# Patient Record
Sex: Male | Born: 1960 | Race: White | Hispanic: No | Smoking: Current every day smoker
Health system: Southern US, Academic
[De-identification: ages and names within clinical notes are randomized; demographics above are authoritative.]

## PROBLEM LIST (undated history)

## (undated) DIAGNOSIS — M545 Low back pain, unspecified: Secondary | ICD-10-CM

## (undated) DIAGNOSIS — K219 Gastro-esophageal reflux disease without esophagitis: Secondary | ICD-10-CM

## (undated) DIAGNOSIS — E119 Type 2 diabetes mellitus without complications: Secondary | ICD-10-CM

## (undated) DIAGNOSIS — I10 Essential (primary) hypertension: Secondary | ICD-10-CM

## (undated) DIAGNOSIS — G629 Polyneuropathy, unspecified: Secondary | ICD-10-CM

## (undated) DIAGNOSIS — F419 Anxiety disorder, unspecified: Secondary | ICD-10-CM

## (undated) HISTORY — PX: HX CORONARY ARTERY BYPASS GRAFT: SHX141

## (undated) HISTORY — PX: HX ACL RECONSTRUCTION: SHX115

## (undated) HISTORY — DX: Low back pain, unspecified: M54.50

## (undated) HISTORY — DX: Type 2 diabetes mellitus without complications: E11.9

## (undated) HISTORY — PX: CORONARY ARTERY BYPASS GRAFT: SHX141

## (undated) HISTORY — DX: Polyneuropathy, unspecified: G62.9

## (undated) HISTORY — DX: Essential (primary) hypertension: I10

## (undated) HISTORY — DX: Gastro-esophageal reflux disease without esophagitis: K21.9

## (undated) HISTORY — DX: Anxiety disorder, unspecified: F41.9

---
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
## 2013-04-09 ENCOUNTER — Ambulatory Visit (HOSPITAL_COMMUNITY): Payer: Self-pay | Admitting: Nurse Practitioner

## 2013-08-01 ENCOUNTER — Encounter (HOSPITAL_COMMUNITY): Payer: Self-pay

## 2013-08-01 NOTE — Ancillary Notes (Addendum)
[facility] Record for: Derrick Morris, Derrick Morris  Created: 07/31/2013 3:11:25 PM  MRN: [id number]  DOB: 1960-07-05  SSN: [id number]  Sex: Male  Height: 6 Feet 0 Inches - Weight: 255  Maiden Name:   Address:   [address]  [place]: [place], [place] [address]  E-Mail:   Day Phone: [phone] Night Phone:  Other Phone:   Phone comments:   Call Back time:   Authorized contact: Judye Bos  Intake Date: 07/31/2013 3:11:25 PM  PCP: [facility] ,    RefMD: Vicenta Dunning MD, Abdi   Primary Insurance: Medicaid - [place]  Secondary Insurance:   Insurance Comments:      -------Referral Assignment------  Where was the original referral directed?: Physician Practice  Was a specific reviewer requested?: Unassigned Referral  How was the reviewer selected?: Rotation  Who requested the specific reviewer?:   How did you hear of our spine program?:   Is this a second opinion?:      -------Merchandiser, retail----------  [employer]:   City of Birth:      ---------- 1st Review ----------     Review Date: 08/12/2013 12:00:20 PM  Review Completed by: Anice Paganini  Impression: Limb Pain  Disposition: Appointment with Me, Other Treatment or Testing  Appt w/ Colleague:   Colleague Name:   Appt How Soon:   Pre Treatment Type: MRI  Pre Treatment Type Details: MRI Type: Lumbar   Pre Treatment Other Test:   Appt Type: First Available Appointment  Instructions: 53 yo male with long history of back and leg pain worse since January. Reported remote history of fracture.  No PT since 4 years ago.  Now has R buttock pain and urinary symptoms but MRI is from before these sympotms.  Had recent ESI at [facility]. He needs new MRI as well as xrays of lumbar spine and hips (ordered) and I can see in clinic with those studies.       ---------- Symptoms ----------     Chief complaint: lower back pain- fx L4-L5  Diagnosis from Other MD:      Symptoms: Pain,Numbness and/or tingling  Other Symptom Description:   Pain Location: Low  back,Hip,Leg,Buttocks,Other  Other Pain Location Description: groin  Where is your pain the worst?: Buttocks  Pain Type: Constant,Dull/Aching,Radiating  Pain Rating: 9  Does the pain radiate to other parts of your body? Yes   Radiate Where: From low back to left leg,From low back to right leg  Other Description:   Does it radiate to the fingers?   Does it radiate below the elbow?   Which specific part of your arm?   Which fingers?   Which part of your arm does pain go to?   How does it radiate to the arm?   Does it radiate to the toes?   Does it radiate below the knee? No  Which specific part of your leg?   Which toes?   Which part of your leg does pain go to? Left Knee,Right Knee  How does it radiate to the leg? Back  Additional pain information right buttock to knee area is the worst painstabbing pain in right hip and buttock area at times"muscle spams that locks me up and unable to walk at times"     Location of Numbness/tingling: Toes,Other  Other Description:numbness in groin- gential  area when sitting for period of time- for last 2-3 years has old fracture from when in his 39's  and then worse 4 years ago  and then these worse symtpoms in Jan 2015  Does numbness/tingling radiate to other parts of your body?:No  Where does the numbess/tingling radiate?:  Other Description:  Does the numbness/tingling radiate below your elbow?:  Which specific part of your arm?:  Which fingers:  Which pat of your arm does the numbness/tingling go to?:  How does the numbness/tingling radiate to your arm?:  Does the numbness/tingling radiate below your knee?:  Which specific part of your leg?:  Which toes:  Which part of your leg does the numbness/tingling go to?:  How does the numbness/tingling radiate to your leg?:  Additional Numbness/tingling information:  Other Description:      Location of Weakness:   Other Description:   Additional Weakness Information:      The symptoms have been present for: 3 - 6 months  The symptoms  began: Jan 2015  Was there a specific event that caused your symptoms?: With no known cause  Additional Narrative Description:   Are you able to perform your daily activities with these symptoms?: Yes  Since what date have you been unable to perform your daily routine?:   The symptoms improve when you: Lie down  Other activities that improve your symptoms:   The symptoms worsen when you: Walk  Other activities that worsen your symptoms: very difficult to walk     ---------- Work History ----------     Are you able to work?: Does not apply  Reasons for not working: Unemployed  Other Reason for not working:   Do you have Work Restrictions:   If applicable, maximum lifting restriction:   How long have you been unable to work:   Have you ever filed a W/C claim related to a neck or back injury?: N  Occupation:   Other Occupation Information:      ---------- Bowel/Bladder/Incontinence Issues ----------     Since the onset of symptoms, have you experienced any new problems urinating or having bowel movements?: Yes  Description: Loss of control of urine causing an accident,Numbness of genital area  Other Description: for last two months has been unable to hold stream from just happeningconstipation all the time  How long have you had these bowel/bladder problems?: 3 or more weeks     ---------- Allergies ----------     Do you have any medication allergies? Yes  Allergies: PCN  Other Details:   Allergic to Latex?: NKA  Allergic to intravenous contrast dyes?: NKA  Allergic to steroids?: NKA     ---------- Treatment/Testing ----------      Taking prescription medication for this problem?: Yes  Are you using any now?: No  Have you received a Medrol dose pack for this problem?: No  When did you last take the dosepack?:   Were your symptoms improved?:   Would you describe your relief as:   How long did you experience that amount of relief?:   Other Medrol dosepack information:      --------------- PT ---------------     PT:  No  When Received:   Where Received:   Other where received information:   Visits:   Types:   Other Types:   Improved:      If improved, describe level of relief:   If improved, how long did you experience relief:   Other Physical Therapy Treatment Information:      ---------- Chiropractic Services ----------     Chiro: Yes  When: 4 years ago   Who:   Visits: 2  Types: Ice/Heat,Manipulation,Modalities - Ultrasound/ E-stim  Other Types:   Were Symptoms Improved: Yes  If improved, describe level of relief: Small  If improved, how long did you experience relief: Short term  Other Chiropractic Treatment Information: 1st time helped and then 2nd time made symptoms worse     --------------- ESI ---------------     ESI: Yes  When: last injection a month ago  Who: [facility]  Visits: 3  Types: ESI  Improved: No  If improved, describe level of relief:   If improved, how long did you experience relief:   Other Injection Treatment Information:      ---------- Diagnostic Tests ----------     Plain spine X-rays On:04-09-13 Where: [facility]  Area(s) Scanned: Lumbar  MRI scan ZO:[id number] at Review Where:   Area Scanned: Lumbar  SI Joints, RT Hip Pelvis XR  On:04-09-13 Where: [facility]   Do you have any of the following in case an MRI is ordered?: None  Other MRI factors: 2010- bypass surgeryno hx cancer     ---------- Past Medical History ----------     What other doctors/providers have treated you for these spine issues? Dr. Vicenta Dunning Date: referring    Ever diagnosed with Spine deformity?:   Prior neck or back surgery (1)?: No  When:   Who:   Area of neck/spine operated on:   Level:   Were symptoms improved?:   If improved, describe level of relief:  If improved, how long did you experience relief?:   Prior neck or back surgery (2)?:   When:   Who:   Area of neck/spine operated on:   Level:   Were symptoms improved?:   If improved, describe level of relief:  If improved, how long did you experience relief?:   Prior neck or back surgery  (3)?:   When:   Who:   Area of neck/spine operated on:   Level:   Were symptoms improved?:   If improved, describe level of relief:  If improved, how long did you experience relief?:   Do you have any of the following assistive devices? Brace  How long have you required these assistive devices? left knee brace  Currently being treated for any other medical condition?: Yes  Conditions: Diabetes,GERDS,Hypercholesterolemia,Hypertension  Other Conditions:   Type of neurologic disorder:   Cancer Type:   Other Treatment/Medication: metformin,glucopaughe,gabapentin- will bring complete list  What blood thinners are you currently taking?: Aspirin  Which physicians are treating you for your other medical conditions?:   Do you smoke?: Yes  Are you pre-menopausal or post-menopausal?:   If recommended, are you willing to consider surgery?:   What is your goal in seeking treatment?:   Other pertinent information/ general comments/ goals for treatment:      ---------- Care Coordinator Information ----------     Care Coordinator: RS  Activity Log: 08/01/2013 12:00:50 PM [Leslie Crossley]: Phone call to pt to clarify his symptoms.  Pt reports urinary, bowel and incontinence issues starting the past 1 1/2 to 2 months, which is worsening.  He reports he has saddle numbness, impotence, trouble making it to the restroom before he has urinary accidents during daytime and nighttime hours.  He reports these symptoms are worsening.  Per referral, pt has significant pathology at L4-5 on MRI from February, 2015 and Dr. Vicenta Dunning has recommended surgery.  Pt reports these symptoms began after this MRI.  Instructed pt to report to [facility] and pt declines, stating he is leaving for  [place] on Monday.  Requested he be evaluated today due to potential urgent spine issue which could potentially cause permanent paralysis or permanent loss of bowel/bladder function.  Pt again declines.  Instructed him to hand carry his MRI with  him to [place] so that if his symptoms worsen, he can be evaluated while OOT.  Dr. Veleta MinersGhodsi's nurse Amy notified.  Instructed pt to have films mailed for surgeon review while he is OOT and he agrees.  Carroll KindsL. Crossley, RN6/11/2013 1:01:49 PM Verlon Au[Leslie Crossley]: Phone call to patient to discuss MD initial impression and recommendations.  Left message and phone  on voice mail for patient to return call.  Carroll KindsL. Crossley, RN6/11/2013 4:01:40 PM Verlon Au[Leslie Crossley]: Patient returned phone call. Reviewed patient's medical history.  Patient states no change in symptoms.  Discussed MD initial impression and recommendations for a lumbar MRI followed by evaluation by Dr. Rod HollerSedney with x-rays.  Provided education on need for updated imaging.  ; Patient chose to accept recommendations.  ; Instructed Referral Specialist will call to schedule appointment/tests once a signed order and insurance authorization are received.  ; Explained that a letter will be faxed to the PCP/referring physician regarding this conversation. Patient verbalized understanding. Carroll KindsL. Crossley, RN 08/28/2013 2:23:20 PM Lawson Fiscal[Lori Mueller]: RS will offer nonoperative provider evaluation to obtain the imaging. LMueller RN CRRN     Letter/Test Coordinator: Letters  Letter/Test Coordinator Log: 08/13/2013 8:12:06 AM Elnita Maxwell[Cheryl Chidester]: Faxed treatment letter patient summary and requisition to Dr. Vicenta DunningGhodsi. CChidester 08/14/2013 8:36:30 AM Rosey Bath[Teresa Benson]: Called and left message for Dr. Vicenta DunningGhodsi nurse Amy for status of requisition and authorization. TBenson 08/15/2013 8:32:01 AM [Gina Wood]: Dr. Veleta MinersGhodsi's receptionist took a message for his nurse Amy to call us regarding the status of MRI requisition. GWood 08/18/2013 4:09:29 PM [Gina Wood]: Requested no authorization and requistion letter to be sent to Dr. Veleta MinersGhodsi's office. GWood 08/19/2013 7:51:57 AM [Cheryl Chidester]: Faxed no requisition letter and refaxed requisition to Dr. Vicenta DunningGhodsi. CChidester.   08/26/2013 10:51:17 AM Rosey Bath[Teresa Benson]: Called and  spoke to patient today and explained that we have not gotten anything back from Dr. Veleta MinersGhodsi's office and he said he did speak to the nurse last Thursday or Friday and Dr. Vicenta DunningGhodsi was out of town and the nurse said he would back today (Tuesday ) and she woud get on this to get done and send back to us. Patient is going to call again today and follow up. Told patient we would give it another couple days and if no luck then we will need to figure out a different route. He understood and will call today. TBenson 08/28/2013 9:41:55 AM Rosey Bath[Teresa Benson]: Patient called and said that Dr. Vicenta DunningGhodsi office is refusing to help with testing ordering and authorization. Will put in RN que to review. TBenson 08/29/2013 9:32:48 AM Aggie Cosier[Crystal Tephabock]: Attempted to contact patient to facilitate scheduling.  Left message and phone  on answering machine for patient to return this call. CVassie Loll. Tephabock 08/29/2013 11:06:30 AM Aggie Cosier[Crystal Tephabock]: Appointment scheduled to see Dr. Burnadette PeterLynch on June 30th at 9:30am. Confirmed with pt. Ctephabock     Intake Specialist Comments:      Clinic Staff Comments:      Last Edited byRaelene Bott: Crystal Tephabock on 08/29/2013 11:07:11 AM  Last Review by: Anice Paganiniara Sedney on 08/12/2013 12:00:20 PM

## 2013-08-12 ENCOUNTER — Other Ambulatory Visit (HOSPITAL_COMMUNITY): Payer: Self-pay | Admitting: Neurological Surgery

## 2013-08-12 DIAGNOSIS — M549 Dorsalgia, unspecified: Secondary | ICD-10-CM

## 2013-09-02 ENCOUNTER — Ambulatory Visit: Payer: MEDICAID | Attending: PHYSICAL MEDICINE AND REHABILITATION | Admitting: PHYSICAL MEDICINE AND REHABILITATION

## 2013-09-02 ENCOUNTER — Ambulatory Visit (HOSPITAL_BASED_OUTPATIENT_CLINIC_OR_DEPARTMENT_OTHER): Payer: MEDICAID

## 2013-09-02 ENCOUNTER — Encounter (INDEPENDENT_AMBULATORY_CARE_PROVIDER_SITE_OTHER): Payer: Self-pay | Admitting: PHYSICAL MEDICINE AND REHABILITATION

## 2013-09-02 VITALS — BP 117/71 | HR 57 | Temp 98.1°F | Wt 250.2 lb

## 2013-09-02 DIAGNOSIS — M5136 Other intervertebral disc degeneration, lumbar region: Secondary | ICD-10-CM

## 2013-09-02 DIAGNOSIS — R209 Unspecified disturbances of skin sensation: Secondary | ICD-10-CM | POA: Insufficient documentation

## 2013-09-02 DIAGNOSIS — M549 Dorsalgia, unspecified: Secondary | ICD-10-CM

## 2013-09-02 DIAGNOSIS — M169 Osteoarthritis of hip, unspecified: Secondary | ICD-10-CM

## 2013-09-02 DIAGNOSIS — IMO0001 Reserved for inherently not codable concepts without codable children: Secondary | ICD-10-CM | POA: Insufficient documentation

## 2013-09-02 DIAGNOSIS — M545 Low back pain, unspecified: Secondary | ICD-10-CM

## 2013-09-02 DIAGNOSIS — M5137 Other intervertebral disc degeneration, lumbosacral region: Secondary | ICD-10-CM

## 2013-09-02 DIAGNOSIS — R269 Unspecified abnormalities of gait and mobility: Secondary | ICD-10-CM | POA: Insufficient documentation

## 2013-09-02 DIAGNOSIS — F17200 Nicotine dependence, unspecified, uncomplicated: Secondary | ICD-10-CM | POA: Insufficient documentation

## 2013-09-02 DIAGNOSIS — M1610 Unilateral primary osteoarthritis, unspecified hip: Secondary | ICD-10-CM

## 2013-09-02 DIAGNOSIS — G8929 Other chronic pain: Secondary | ICD-10-CM | POA: Insufficient documentation

## 2013-09-02 DIAGNOSIS — R29898 Other symptoms and signs involving the musculoskeletal system: Secondary | ICD-10-CM

## 2013-09-02 DIAGNOSIS — S335XXA Sprain of ligaments of lumbar spine, initial encounter: Secondary | ICD-10-CM

## 2013-09-02 DIAGNOSIS — M47817 Spondylosis without myelopathy or radiculopathy, lumbosacral region: Secondary | ICD-10-CM

## 2013-09-02 DIAGNOSIS — M51379 Other intervertebral disc degeneration, lumbosacral region without mention of lumbar back pain or lower extremity pain: Secondary | ICD-10-CM | POA: Insufficient documentation

## 2013-09-02 DIAGNOSIS — Z880 Allergy status to penicillin: Secondary | ICD-10-CM | POA: Insufficient documentation

## 2013-09-02 DIAGNOSIS — R32 Unspecified urinary incontinence: Secondary | ICD-10-CM | POA: Insufficient documentation

## 2013-09-02 DIAGNOSIS — R159 Full incontinence of feces: Secondary | ICD-10-CM | POA: Insufficient documentation

## 2013-09-02 NOTE — Progress Notes (Signed)
To be dictated  Ruben GottronJohn Brentney Goldbach, MD 09/02/2013, 09:33    To be dictated  Ruben GottronJohn Tallen Schnorr, MD 09/02/2013, 10:24    To be dictated  Ruben GottronJohn Ermalee Mealy, MD 09/02/2013, 10:38

## 2013-09-03 NOTE — H&P (Signed)
[facility]                              DEPART[facility] OF [place]                                , [place] [id number]                                PATIENT NAME: Derrick Morris, Derrick Morris  HOSPITAL NUMBER:[id number]  DATE OF SERVICE:09/02/2013  DATE OF BIRTH: 10-30-1960    HISTORY AND PHYSICAL    REFERRING PHYSICIAN:  Hansel FeinsteinSeyed Abdi Ghodsi, MD    HISTORY OF PRESENT ILLNESS:  [name] is a 53 year old white male who has a 30-year history of back problems.  He relates that he had increasing symptoms since 2011 and then further noticed increase in January 2015.  He relates he could not stand for a long period of time, had to quit working when back pain symptoms had worsened.  He also has developed new intermittent bowel and bladder incontinence issues.  He had a lumbar spine MRI that was reviewed by Dr. Rod HollerSedney.  She noted a remote history of back problems and noted increasing right buttock pain and urinary symptoms, but she noted his old MRI predated his increasing symptoms.  She requests a new MRI and she would see him.  He notes that in addition he has persistent low back pain.  He has referred pain down the right leg intermittently and cramping spasms down to his calf.  Both legs feel weak, left greater than right, and also gets some intermittent mid back pain.  He has some chronic left knee problems from ligamentous laxity and osteoarthritis and does wear a knee brace on the left knee and ambulates with a cane in his right hand.  He notes that he had 1 injection at [facility] April 2015 with no help.  He relates Dr. Vicenta DunningGhodsi had recommended surgery, but he wants a second opinion.    PAST MEDICAL HISTORY:  Coronary artery disease.  He has had bypass surgery.  He has a history of diabetes, peripheral neuropathy, elevated cholesterol, elevated blood pressure, anxiety, depression, gastric reflux, and left knee osteoarthritis.    PAST SURGICAL HISTORY:  [place] and [place] ACL repair in 1983.    CURRENT MEDICATIONS:  1.  Tylenol No. 3, he takes up to 3 a day.  2.  Gabapentin 600 three a day.  Other medicines per Epic.    ALLERGIES:  He is allergic to penicillin.    SOCIAL HISTORY:  He is unemployed, last worked January 2015.  Smokes 2 packs a day of cigarettes.  No alcohol.  Has applied for disability and been denied twice and is awaiting a hearing now.    FAMILY HISTORY:  He relates he is adopted.    REVIEW OF SYSTEMS:  He relates he has lost his appetite and he is down about 20 pounds in the last 4 months.  He relates he has difficulty urinating at times, difficulty holding his urine and has some urinary frequency.  General numbness at times.  Has had some incontinence of his urine back in February.  He relates he also has had some alternating constipation and diarrhea and has  had an episode of bowel incontinence when he woke up from sleep.  He has some chest pain when he is stressed and chronic knee problems, numbness and tingling he relates from his diabetes, anxiety, depression, sleeps about 4-5 hours on a good night's sleep.    PHYSICAL EXAMINATION:  He is ambulatory with a cane and left knee brace.  Weight 113 kg, blood pressure 117/71, pulse 57 and regular, temperature 98.1 degrees Fahrenheit.  He is alert and oriented.  No acute distress.  Lungs:  Clear.  Heart:  Regular rate and rhythm.  Neurologically:  Strength in lower extremities is grossly intact at 4+ to 5/5, left knee brace on.  Reflexes symmetric 1-2/4.  Tone is normal.  Back is tender across the paralumbrical region.  Gait is slow and antalgic with his cane.    IMPRESSION:  Chronic low back pain, lumbar degenerative disk disease, radicular symptoms down into his right leg.  He relates intermittent bowel and bladder incontinence and some general numbness.  Previous history of fracture of lumbar spine L4-L5.    PLAN:  We will obtain a new lumbar spine MRI before his appointment  with Dr. Rod HollerSedney.  We will also obtain lumbar spine and hip x-rays.  We will give him a slip for physical therapy and he will continue pain medicines per his family physician.  I will see him back after Dr. Rod HollerSedney has had a chance to see him.      Tacy LearnJ. Reyansh Kushnir, MD  Assistant Professor  [facility]    [id number]; D: 09/02/2013 11:12:05; T: 09/03/2013 08:06:13    cc: Hansel FeinsteinSeyed Abdi Ghodsi MD      [address]      [place], [place] [phone]

## 2013-09-12 ENCOUNTER — Ambulatory Visit (INDEPENDENT_AMBULATORY_CARE_PROVIDER_SITE_OTHER): Payer: Self-pay | Admitting: PHYSICAL MEDICINE AND REHABILITATION

## 2013-09-12 NOTE — Telephone Encounter (Signed)
Received the following message from Ortho scheduler:    Please advise patient's MRI has already been approved with Dr.Ghodsi office [facility]. Patient will contact his office to get MRI scheduled and will contact me back to advise when scheduled  Thanks

## 2013-09-16 ENCOUNTER — Ambulatory Visit (INDEPENDENT_AMBULATORY_CARE_PROVIDER_SITE_OTHER): Payer: Self-pay | Admitting: Neurological Surgery

## 2013-09-16 NOTE — Telephone Encounter (Signed)
-----   Message from Derrick Morris sent at 09/15/2013 11:06 AM EDT -----  >> Derrick Morris 09/15/2013 11:06 AM  Dr. Rod HollerSedney pt  Patient called stating that he has not had his 2nd MRI, he said Dr. Rod HollerSedney looked at his first MRI done in Feb and x rays through the spine center and determined he needed a 2nd MRI.  He wants to know if he should keep this apt on Thursday with her or should he reschedule, please call him to advise.

## 2013-09-16 NOTE — Telephone Encounter (Signed)
I called and advised pt that he is going to get the MRI done then r.s  Natale LayMichelle D Marguetta Windish, LPN  1/[id number]/47827/14/2015, 09:42

## 2013-09-18 ENCOUNTER — Ambulatory Visit (INDEPENDENT_AMBULATORY_CARE_PROVIDER_SITE_OTHER): Payer: Self-pay | Admitting: Neurological Surgery

## 2013-10-07 ENCOUNTER — Ambulatory Visit (INDEPENDENT_AMBULATORY_CARE_PROVIDER_SITE_OTHER): Payer: Self-pay | Admitting: PHYSICAL MEDICINE AND REHABILITATION

## 2013-10-07 NOTE — Telephone Encounter (Signed)
Fax[facility] signed PT [facility] [phone]) [phone].  Plan is for 2-3/week for 6 weeks.  Goals are strengthening, soft tissue/joint mobility, flexibility training, modalities, posture/body mech's, proprioception/balance, gait training, ADL training and HEP.

## 2013-10-17 ENCOUNTER — Ambulatory Visit (INDEPENDENT_AMBULATORY_CARE_PROVIDER_SITE_OTHER): Payer: Self-pay | Admitting: PHYSICAL MEDICINE AND REHABILITATION

## 2013-10-17 NOTE — Telephone Encounter (Signed)
Faxed a signed PT continuation of care as patient still requires verbal cues for strength, AROM/PROM, tissue extensibility/spasm, coordination/muscle recruitment, abdominal bracing and work/functional/daily activity capability.  The PT notes will be scanned to medical records.

## 2013-10-24 ENCOUNTER — Other Ambulatory Visit (INDEPENDENT_AMBULATORY_CARE_PROVIDER_SITE_OTHER): Payer: Self-pay | Admitting: PHYSICAL MEDICINE AND REHABILITATION

## 2013-10-24 DIAGNOSIS — M5136 Other intervertebral disc degeneration, lumbar region: Secondary | ICD-10-CM

## 2013-10-24 DIAGNOSIS — S335XXA Sprain of ligaments of lumbar spine, initial encounter: Secondary | ICD-10-CM

## 2013-10-24 DIAGNOSIS — M545 Low back pain, unspecified: Secondary | ICD-10-CM

## 2013-10-28 ENCOUNTER — Ambulatory Visit (INDEPENDENT_AMBULATORY_CARE_PROVIDER_SITE_OTHER): Payer: Self-pay | Admitting: PHYSICAL MEDICINE AND REHABILITATION

## 2013-10-28 NOTE — Telephone Encounter (Signed)
Please advise per patient's emergency contact he has moved back to [place] and will not be needing MRI here at [facility].

## 2013-11-25 ENCOUNTER — Ambulatory Visit (INDEPENDENT_AMBULATORY_CARE_PROVIDER_SITE_OTHER): Payer: Self-pay | Admitting: PHYSICAL MEDICINE AND REHABILITATION

## 2013-11-25 NOTE — Telephone Encounter (Signed)
Faxed a signed PT discharge to [facility] @ [phone].  Patient has moved.

## 2013-12-02 ENCOUNTER — Encounter (INDEPENDENT_AMBULATORY_CARE_PROVIDER_SITE_OTHER): Payer: MEDICAID | Admitting: PHYSICAL MEDICINE AND REHABILITATION

## 2014-02-12 ENCOUNTER — Ambulatory Visit (INDEPENDENT_AMBULATORY_CARE_PROVIDER_SITE_OTHER): Payer: Self-pay

## 2014-02-12 ENCOUNTER — Ambulatory Visit (INDEPENDENT_AMBULATORY_CARE_PROVIDER_SITE_OTHER): Payer: Self-pay | Admitting: Family Medicine

## 2014-02-12 VITALS — BP 124/70 | HR 68 | Temp 97.8°F | Resp 18 | Ht 71.5 in | Wt 255.2 lb

## 2014-02-12 DIAGNOSIS — R059 Cough, unspecified: Secondary | ICD-10-CM

## 2014-02-12 DIAGNOSIS — R062 Wheezing: Secondary | ICD-10-CM

## 2014-02-12 DIAGNOSIS — Z720 Tobacco use: Secondary | ICD-10-CM

## 2014-02-12 DIAGNOSIS — J209 Acute bronchitis, unspecified: Secondary | ICD-10-CM

## 2014-02-12 DIAGNOSIS — R05 Cough: Secondary | ICD-10-CM

## 2014-02-12 DIAGNOSIS — R0789 Other chest pain: Secondary | ICD-10-CM

## 2014-02-12 DIAGNOSIS — J0110 Acute frontal sinusitis, unspecified: Secondary | ICD-10-CM

## 2014-02-12 DIAGNOSIS — F17200 Nicotine dependence, unspecified, uncomplicated: Secondary | ICD-10-CM

## 2014-02-12 MED ORDER — HYDROCODONE-HOMATROPINE 5-1.5 MG/5ML PO SYRP: 5.0000 mL | ORAL_SOLUTION | ORAL | Status: AC | PRN

## 2014-02-12 MED ORDER — ALBUTEROL SULFATE HFA 108 (90 BASE) MCG/ACT IN AERS: 2.0000 | INHALATION_SPRAY | RESPIRATORY_TRACT | Status: AC | PRN

## 2014-02-12 MED ORDER — ALBUTEROL SULFATE (2.5 MG/3ML) 0.083% IN NEBU
2.5000 mg | INHALATION_SOLUTION | Freq: Once | RESPIRATORY_TRACT | Status: AC
  Administered 2014-02-12: 2.5 mg via RESPIRATORY_TRACT

## 2014-02-12 MED ORDER — AZITHROMYCIN 250 MG PO TABS: ORAL_TABLET | ORAL | Status: AC

## 2014-02-12 NOTE — Patient Instructions (Signed)
Drink plenty of fluids.  Get rid of the cigarettes as discussed  Take the azithromycin 2 pills initially, then 1 daily for 4 days  Use the cough syrup 1 teaspoon every 4-6 hours when needed  As the albuterol inhaler 2 relations every 4-6 hours as directed  Return if increasing problems with shortness of breath or high fevers or not improving.

## 2014-02-12 NOTE — Progress Notes (Signed)
Subjective: 53 year old man who is here with a about a 5 or 6 day history of a respiratory tract infections. He started last weekend with a cold that settled down into his chest. He has had a lot of head congestion also and some headache and pain. He is aware of a lot of postnasal drainage. He smokes one half pack cigarettes a day. He has had problems with respiratory tract infections all of his life.  [place], [place] with his significant other now. He has a history of coronary bypass grafting 5 years ago. He is diabetic, has chronic back problems, and is in the process of applying for disability.  Objective: Overweight male who obviously doesn't feel well. Head is congested and he coughs intermittently. TMs normal. Some tenderness of his fall sinuses, especially on the right. Throat is mildly erythematous. There is some edema of the pharyngeal tissues. Neck supple without significant nodes. Chest has poor air exchange. Soft expiratory wheeze. Some scattered rhonchi in the right lower lung. He describes having chest wall pain in the left lower lung area.  Peak flow was maximum of 340 with a predicted 590.  Assessment: URI with secondary sinusitis and bronchitis Tobacco use disorder Cough Wheezes  Plan: Albuterol nebulizer Chest x-ray  [facility] reading (PRIMARY) by  Dr. Alwyn RenHopper Minimal haziness left lower lung but not enough to call a definite pneumonia.  Repeat peak flow was a little improved at 370  Will treat with cough medication, inhaler, and azithromycin  Return if not improving  Discussed the need to get rid of the cigarettes. He's been smoking for many years, and understands the difficulty of getting rid of them, but also knows the need for them. He has had CABG. He is also diabetic.  .Marland Kitchen

## 2016-06-15 IMAGING — CR DG CHEST 2V
3 series · 3 of 3 positions shown · non-contrast
Comparison: None.

CLINICAL DATA: Wheezing, shortness of breath and chest pain.

EXAM:
CHEST  2 VIEW

[PA (1 of 2)]
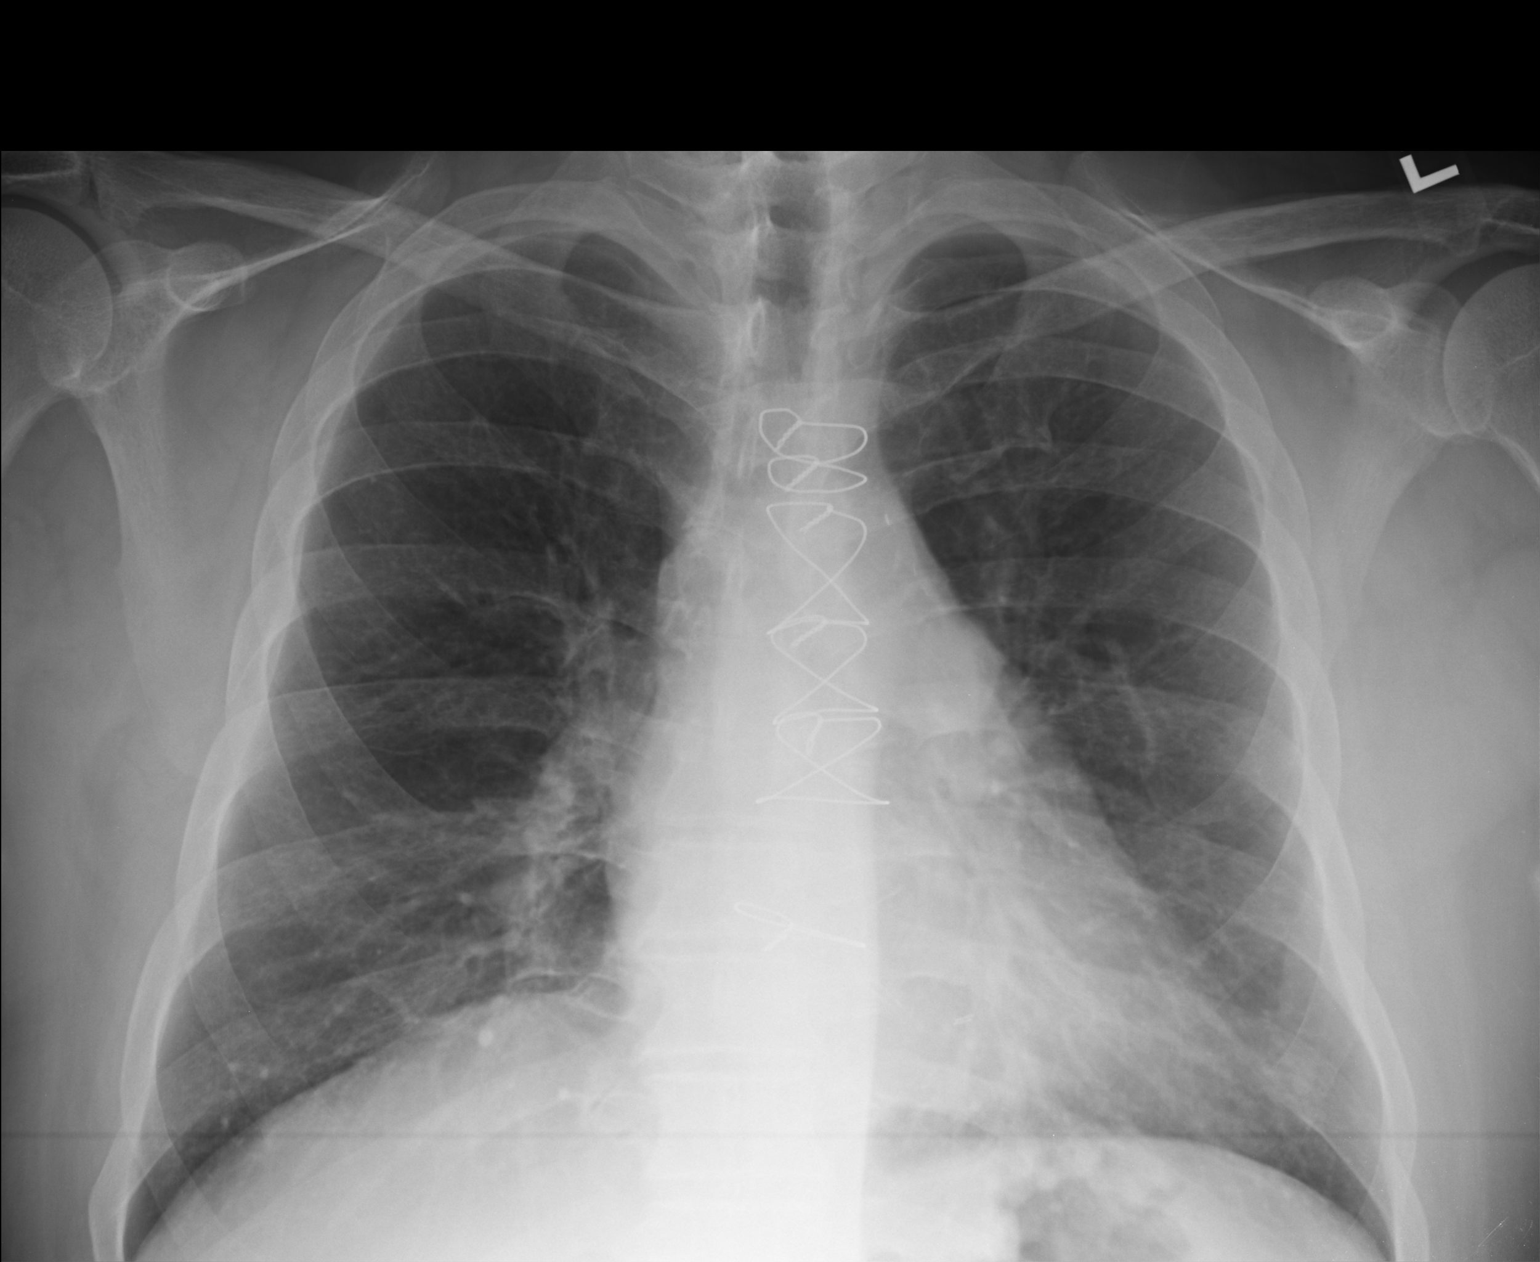

[lateral]
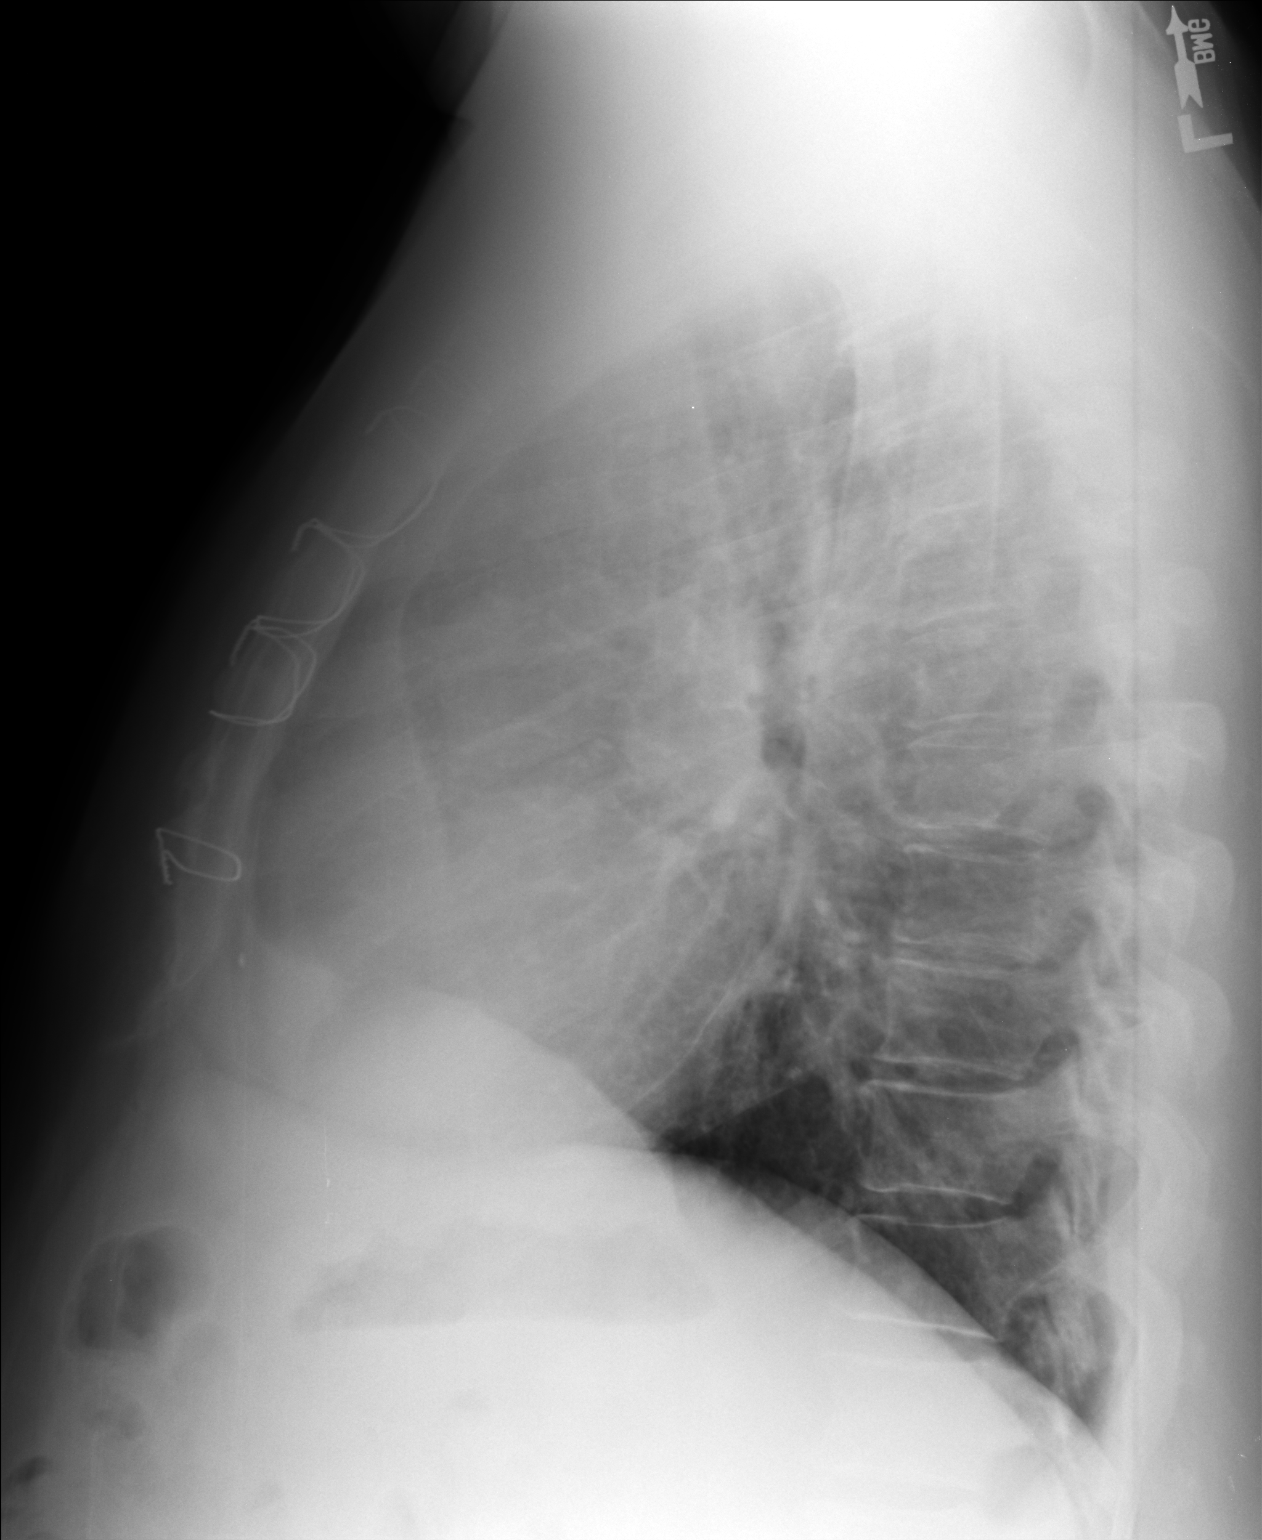

[PA (2 of 2)]
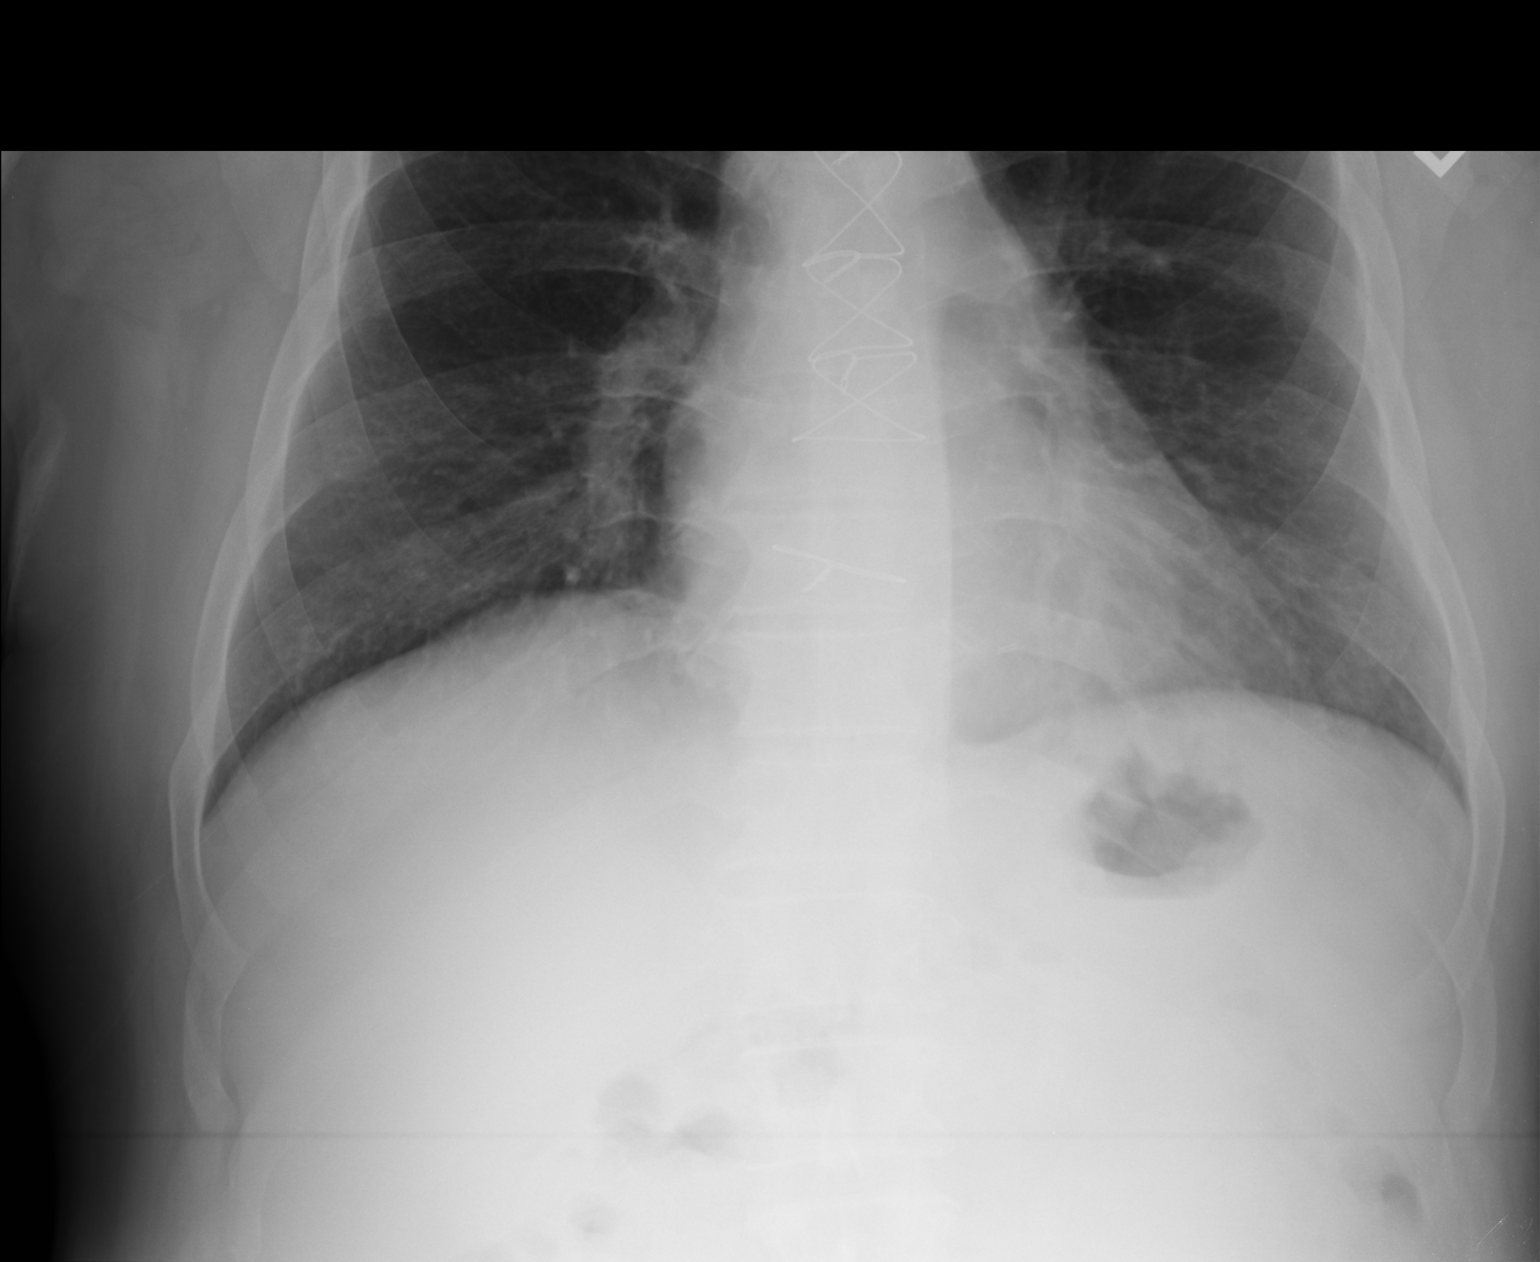

[3 of 3 positions shown; findings below may reference images not displayed]

FINDINGS: Sternotomy wires are intact. Lungs are adequately inflated without
consolidation or effusion. The cardiomediastinal silhouette is
within normal. There are minimal degenerative changes of the spine.
IMPRESSION: No active cardiopulmonary disease.
